# Patient Record
Sex: Female | Born: 1970 | Race: White | Hispanic: No | Marital: Married | Smoking: Current every day smoker
Health system: Southern US, Community
[De-identification: ages and names within clinical notes are randomized; demographics above are authoritative.]

## PROBLEM LIST (undated history)

## (undated) DIAGNOSIS — I2699 Other pulmonary embolism without acute cor pulmonale: Secondary | ICD-10-CM

## (undated) DIAGNOSIS — E785 Hyperlipidemia, unspecified: Secondary | ICD-10-CM

## (undated) DIAGNOSIS — E559 Vitamin D deficiency, unspecified: Secondary | ICD-10-CM

## (undated) HISTORY — PX: TUBAL LIGATION: SHX77

---

## 1999-03-13 ENCOUNTER — Other Ambulatory Visit: Admission: RE | Admit: 1999-03-13 | Discharge: 1999-03-13 | Payer: Self-pay | Admitting: Obstetrics & Gynecology

## 2002-06-29 ENCOUNTER — Other Ambulatory Visit: Admission: RE | Admit: 2002-06-29 | Discharge: 2002-06-29 | Payer: Self-pay | Admitting: Obstetrics and Gynecology

## 2002-07-01 ENCOUNTER — Encounter: Payer: Self-pay | Admitting: Obstetrics & Gynecology

## 2002-07-01 ENCOUNTER — Encounter: Admission: RE | Admit: 2002-07-01 | Discharge: 2002-07-01 | Payer: Self-pay | Admitting: Obstetrics & Gynecology

## 2016-03-20 DIAGNOSIS — H16403 Unspecified corneal neovascularization, bilateral: Secondary | ICD-10-CM | POA: Diagnosis not present

## 2017-05-25 DIAGNOSIS — Z0000 Encounter for general adult medical examination without abnormal findings: Secondary | ICD-10-CM | POA: Diagnosis not present

## 2017-05-25 DIAGNOSIS — Z1231 Encounter for screening mammogram for malignant neoplasm of breast: Secondary | ICD-10-CM | POA: Diagnosis not present

## 2017-06-02 DIAGNOSIS — E559 Vitamin D deficiency, unspecified: Secondary | ICD-10-CM | POA: Diagnosis not present

## 2017-06-02 DIAGNOSIS — Z0000 Encounter for general adult medical examination without abnormal findings: Secondary | ICD-10-CM | POA: Diagnosis not present

## 2017-09-16 DIAGNOSIS — L237 Allergic contact dermatitis due to plants, except food: Secondary | ICD-10-CM | POA: Diagnosis not present

## 2017-11-12 ENCOUNTER — Emergency Department (HOSPITAL_COMMUNITY)
Admission: EM | Admit: 2017-11-12 | Discharge: 2017-11-12 | Disposition: A | Discharge status: Home / Self Care | Payer: 59 | Attending: Emergency Medicine | Admitting: Emergency Medicine

## 2017-11-12 ENCOUNTER — Other Ambulatory Visit: Payer: Self-pay

## 2017-11-12 ENCOUNTER — Emergency Department (HOSPITAL_COMMUNITY): Payer: 59

## 2017-11-12 ENCOUNTER — Encounter (HOSPITAL_COMMUNITY): Payer: Self-pay | Admitting: Emergency Medicine

## 2017-11-12 DIAGNOSIS — E785 Hyperlipidemia, unspecified: Secondary | ICD-10-CM | POA: Insufficient documentation

## 2017-11-12 DIAGNOSIS — F17210 Nicotine dependence, cigarettes, uncomplicated: Secondary | ICD-10-CM | POA: Insufficient documentation

## 2017-11-12 DIAGNOSIS — K828 Other specified diseases of gallbladder: Secondary | ICD-10-CM | POA: Diagnosis not present

## 2017-11-12 DIAGNOSIS — I2699 Other pulmonary embolism without acute cor pulmonale: Secondary | ICD-10-CM | POA: Insufficient documentation

## 2017-11-12 DIAGNOSIS — R0789 Other chest pain: Secondary | ICD-10-CM | POA: Diagnosis not present

## 2017-11-12 DIAGNOSIS — R0602 Shortness of breath: Secondary | ICD-10-CM | POA: Diagnosis not present

## 2017-11-12 DIAGNOSIS — R1011 Right upper quadrant pain: Secondary | ICD-10-CM | POA: Diagnosis not present

## 2017-11-12 DIAGNOSIS — R079 Chest pain, unspecified: Secondary | ICD-10-CM | POA: Diagnosis not present

## 2017-11-12 HISTORY — DX: Vitamin D deficiency, unspecified: E55.9

## 2017-11-12 HISTORY — DX: Hyperlipidemia, unspecified: E78.5

## 2017-11-12 HISTORY — DX: Other pulmonary embolism without acute cor pulmonale: I26.99

## 2017-11-12 LAB — BASIC METABOLIC PANEL
Anion gap: 7 (ref 5–15)
BUN: 7 mg/dL (ref 6–20)
CO2: 25 mmol/L (ref 22–32)
Calcium: 8.9 mg/dL (ref 8.9–10.3)
Chloride: 106 mmol/L (ref 98–111)
Creatinine, Ser: 0.72 mg/dL (ref 0.44–1.00)
Glucose, Bld: 158 mg/dL — ABNORMAL HIGH (ref 70–99)
POTASSIUM: 3.7 mmol/L (ref 3.5–5.1)
SODIUM: 138 mmol/L (ref 135–145)

## 2017-11-12 LAB — CBC
HCT: 35.6 % — ABNORMAL LOW (ref 36.0–46.0)
Hemoglobin: 11.4 g/dL — ABNORMAL LOW (ref 12.0–15.0)
MCH: 33.3 pg (ref 26.0–34.0)
MCHC: 32 g/dL (ref 30.0–36.0)
MCV: 104.1 fL — ABNORMAL HIGH (ref 78.0–100.0)
PLATELETS: 221 10*3/uL (ref 150–400)
RBC: 3.42 MIL/uL — ABNORMAL LOW (ref 3.87–5.11)
RDW: 11.5 % (ref 11.5–15.5)
WBC: 9.2 10*3/uL (ref 4.0–10.5)

## 2017-11-12 LAB — I-STAT TROPONIN, ED: TROPONIN I, POC: 0 ng/mL (ref 0.00–0.08)

## 2017-11-12 MED ORDER — APIXABAN (ELIQUIS) EDUCATION KIT FOR DVT/PE PATIENTS: 1.0000 | PACK | Freq: Once | 0 refills | Status: AC

## 2017-11-12 MED ORDER — TRAMADOL HCL 50 MG PO TABS: 50.0000 mg | ORAL_TABLET | Freq: Four times a day (QID) | ORAL | 0 refills | Status: AC | PRN

## 2017-11-12 MED ORDER — ELIQUIS 5 MG VTE STARTER PACK: ORAL_TABLET | ORAL | 0 refills | Status: AC

## 2017-11-12 MED ORDER — APIXABAN 5 MG PO TABS
10.0000 mg | ORAL_TABLET | Freq: Once | ORAL | Status: AC
  Administered 2017-11-12: 10 mg via ORAL
  Filled 2017-11-12: qty 2

## 2017-11-12 NOTE — ED Provider Notes (Signed)
Patient placed in Quick Look pathway, seen and evaluated   Chief Complaint:chest pain  HPI: Carla Graham is a 47 y.o. female who presents to the ED with right side pain. Patient states that she developed shortness of breath 4 days ago that was worse with lying flat. Patient reports going to [facility] yesterday and having an elevated D-dimer and then a CT that showed a PE in the right lower lobe. Patient reports they wanted to start her on blood thinner but she did not start them. She has her paper work from yesterday visit.   ROS: CV: chest pain  Physical Exam:  BP (!) 145/81 (BP Location: Right Arm)   Pulse (!) 107   Temp 99.6 F (37.6 C) (Oral)   Resp 20   LMP 11/06/2017 (Exact Date)   SpO2 99%    Gen: No distress  Neuro: Awake and Alert  Skin: Warm and dry  Lungs: decreased breath sounds right   Heart: tachycardia   Initiation of care has begun. The patient has been counseled on the process, plan, and necessity for staying for the completion/evaluation, and the remainder of the medical screening examination    [name], NP 11/12/17 Sharyl Nimrod, MD 11/12/17 2032

## 2017-11-12 NOTE — ED Provider Notes (Signed)
[facility] EMERGENCY DEPARTMENT Provider Note   CSN: [id number] Arrival date & time: 11/12/17  1755     History   Chief Complaint Chief Complaint  Patient presents with  . Chest Pain    HPI Carla Graham is a 47 y.o. female.  The history is provided by the patient and medical records. No language interpreter was used.  Chest Pain       47 year old female presented to ED for evaluation of chest pain.  Patient report having pleuritic chest pain ongoing for the past 4 days.  Pain is located to right lower lateral chest, more prominent at nighttime especially when she lays down.  It was very minimal shortness of breath.  No fever chills lightheadedness or dizziness productive cough or hemoptysis.  No nausea vomiting diarrhea pain with eating.  Was initially seen at [facility] yesterday for her symptoms.  Chest CT angiogram was done and patient report she had a PE in the right distal lower lobe.  She was prescribed Eliquis 10 mg twice daily as treatment.  However, patient states she went to pharmacy to get the medication filled but it requires prior authorization because the dose is higher than usual.  She still endorse pain throughout the day today and decided to come here for further evaluation.  She did take ibuprofen 800 mg prior to arrival and now her pain has mostly subsided.  Patient denies any prior history of PE or DVT, no recent surgery, prolonged bedrest, active cancer, hemoptysis, or taking oral hormone.  Past Medical History:  Diagnosis Date  . Hyperlipidemia   . Pulmonary embolism ([place])   . Vitamin D deficiency     There are no active problems to display for this patient.   Past Surgical History:  Procedure Laterality Date  . TUBAL LIGATION       OB History   None      Home Medications    Prior to Admission medications   Not on File    Family History No family history on file.  Social History Social History   Tobacco Use    . Smoking status: Current Every Day Smoker    Packs/day: 0.50    Types: Cigarettes  . Smokeless tobacco: Never Used  Substance Use Topics  . Alcohol use: Yes    Comment: rare  . Drug use: Never     Allergies   Hydrocodone   Review of Systems Review of Systems  Cardiovascular: Positive for chest pain.  All other systems reviewed and are negative.    Physical Exam Updated Vital Signs BP (!) 145/81 (BP Location: Right Arm)   Pulse (!) 107   Temp 99.6 F (37.6 C) (Oral)   Resp 20   LMP 11/06/2017 (Exact Date)   SpO2 99%   Physical Exam  Constitutional: She appears well-developed and well-nourished. No distress.  Report pain when laying flat.  Pain to right lower chest.  HENT:  Head: Atraumatic.  Eyes: Conjunctivae are normal.  Neck: Neck supple.  Cardiovascular: Normal rate and regular rhythm.  Pulmonary/Chest: Effort normal and breath sounds normal.  Abdominal: Soft. Bowel sounds are normal. She exhibits no distension. There is no tenderness.  Musculoskeletal:       Right lower leg: She exhibits no edema.       Left lower leg: She exhibits no edema.  Neurological: She is alert.  Skin: No rash noted.  Psychiatric: She has a normal mood and affect.  Nursing note and  vitals reviewed.    ED Treatments / Results  Labs (all labs ordered are listed, but only abnormal results are displayed) Labs Reviewed  BASIC METABOLIC PANEL - Abnormal; Notable for the following components:      Result Value   Glucose, Bld 158 (*)    All other components within normal limits  CBC - Abnormal; Notable for the following components:   RBC 3.42 (*)    Hemoglobin 11.4 (*)    HCT 35.6 (*)    MCV 104.1 (*)    All other components within normal limits  I-STAT TROPONIN, ED    EKG EKG Interpretation  Date/Time:  Thursday November 12 2017 21:04:48 EDT Ventricular Rate:  82 PR Interval:  134 QRS Duration: 76 QT Interval:  362 QTC Calculation: 422 R Axis:   59 Text  Interpretation:  Normal sinus rhythm Nonspecific T wave abnormality Abnormal ECG No old tracing to compare Confirmed by [place], Inocente Salles [phone]) on 11/12/2017 9:16:06 PM   Radiology Dg Chest 2 View  Result Date: 11/12/2017 CLINICAL DATA:  Right lower lobe PE on outside hospital CT EXAM: CHEST - 2 VIEW COMPARISON:  None. FINDINGS: Lungs are clear.  No pleural effusion or pneumothorax. The heart is normal in size. Visualized osseous structures are within normal limits. IMPRESSION: Normal chest radiographs. Electronically Signed   By: Julian Hy M.D.   On: 11/12/2017 19:24    Procedures Procedures (including critical care time)  Medications Ordered in ED Medications  apixaban (ELIQUIS) tablet 10 mg (10 mg Oral Given 11/12/17 2135)     Initial Impression / Assessment and Plan / ED Course  I have reviewed the triage vital signs and the nursing notes.  Pertinent labs & imaging results that were available during my care of the patient were reviewed by me and considered in my medical decision making (see chart for details).     BP (!) 145/81 (BP Location: Right Arm)   Pulse (!) 107   Temp 99.6 F (37.6 C) (Oral)   Resp 20   LMP 11/06/2017 (Exact Date)   SpO2 99%    Final Clinical Impressions(s) / ED Diagnoses   Final diagnoses:  Acute pulmonary embolism, unspecified pulmonary embolism type, unspecified whether acute cor pulmonale present [facility])    ED Discharge Orders         Ordered    ELIQUIS STARTER PACK (ELIQUIS STARTER PACK) 5 MG TABS     11/12/17 2106    apixaban (ELIQUIS) KIT   Once     11/12/17 2106    traMADol (ULTRAM) 50 MG tablet  Every 6 hours PRN     11/12/17 2108         8:55 PM Patient here with right-sided chest pain and right upper quadrant abdominal pain for the past 4 days.  Was actually seen at [facility] this morning for her complaint.  Her work-up is remarkable for an elevated d-dimer as well as a CT scan which shows evidence of PE.  No  evidence of right heart strain were noted.  She also had an right upper quadrant abdominal ultrasound that showed small amount of sludge within the gallbladder but no evidence of stones or cholecystitis.  Patient was initially given Eliquis but declined.  She received a prescription for Eliquis, but states when she went to get it filled, it requires prior authorization because the dosage was higher than recommended.  She was prescribed Eliquis 10 mg twice daily.  I have a chance to review all  of her labs and imaging results that patient brought along with her.  At this time, I felt his symptom is likely related to her pulmonary embolism.  She will receive initial dose of Eliquis in the ED.  Patient discharged home with coupon for Eliquis as well as an educational kit.  She will need to follow-up with her primary care doctor for further prior authorization.  Recommend patient to avoid taking Advil for pain.  Patient will receive opiate pain medication to use at nighttime when her pain is most significant. In order to decrease risk of narcotic abuse. Pt's record were checked using the [place] Controlled Substance database.     Tran, [place], PA-C 11/12/17 2155    Jacubowitz, Sam, MD 11/13/17 0121  

## 2017-11-12 NOTE — ED Triage Notes (Signed)
Patient to ED c/o worsening pain in R lateral side, more with lying flat and taking deep breaths x 4 days. She reports going to [facility] yesterday and had CTA done that showed PE in R distal lower lobe. She denies shortness of breath. Resp e/u, skin warm/dry.

## 2017-11-13 DIAGNOSIS — I2699 Other pulmonary embolism without acute cor pulmonale: Secondary | ICD-10-CM | POA: Diagnosis not present

## 2017-11-13 DIAGNOSIS — Z6825 Body mass index (BMI) 25.0-25.9, adult: Secondary | ICD-10-CM | POA: Diagnosis not present

## 2017-11-13 DIAGNOSIS — E663 Overweight: Secondary | ICD-10-CM | POA: Diagnosis not present

## 2018-01-27 DIAGNOSIS — I2699 Other pulmonary embolism without acute cor pulmonale: Secondary | ICD-10-CM | POA: Diagnosis not present

## 2018-01-27 DIAGNOSIS — E559 Vitamin D deficiency, unspecified: Secondary | ICD-10-CM | POA: Diagnosis not present

## 2018-01-27 DIAGNOSIS — Z79899 Other long term (current) drug therapy: Secondary | ICD-10-CM | POA: Diagnosis not present

## 2018-01-27 DIAGNOSIS — E663 Overweight: Secondary | ICD-10-CM | POA: Diagnosis not present

## 2018-01-27 DIAGNOSIS — R739 Hyperglycemia, unspecified: Secondary | ICD-10-CM | POA: Diagnosis not present

## 2018-01-27 DIAGNOSIS — E7800 Pure hypercholesterolemia, unspecified: Secondary | ICD-10-CM | POA: Diagnosis not present

## 2018-01-27 DIAGNOSIS — Z6825 Body mass index (BMI) 25.0-25.9, adult: Secondary | ICD-10-CM | POA: Diagnosis not present

## 2018-01-27 DIAGNOSIS — D509 Iron deficiency anemia, unspecified: Secondary | ICD-10-CM | POA: Diagnosis not present

## 2018-02-04 DIAGNOSIS — D649 Anemia, unspecified: Secondary | ICD-10-CM | POA: Diagnosis not present

## 2018-02-04 DIAGNOSIS — Z8489 Family history of other specified conditions: Secondary | ICD-10-CM | POA: Diagnosis not present

## 2018-02-04 DIAGNOSIS — N920 Excessive and frequent menstruation with regular cycle: Secondary | ICD-10-CM

## 2018-02-04 DIAGNOSIS — I2699 Other pulmonary embolism without acute cor pulmonale: Secondary | ICD-10-CM | POA: Diagnosis not present

## 2018-02-04 DIAGNOSIS — D508 Other iron deficiency anemias: Secondary | ICD-10-CM

## 2018-02-04 DIAGNOSIS — Z7901 Long term (current) use of anticoagulants: Secondary | ICD-10-CM | POA: Diagnosis not present

## 2018-02-11 DIAGNOSIS — Z7901 Long term (current) use of anticoagulants: Secondary | ICD-10-CM | POA: Diagnosis not present

## 2018-02-11 DIAGNOSIS — I2699 Other pulmonary embolism without acute cor pulmonale: Secondary | ICD-10-CM | POA: Diagnosis not present

## 2018-02-18 DIAGNOSIS — Z7901 Long term (current) use of anticoagulants: Secondary | ICD-10-CM | POA: Diagnosis not present

## 2018-02-18 DIAGNOSIS — Z7982 Long term (current) use of aspirin: Secondary | ICD-10-CM

## 2018-02-18 DIAGNOSIS — Z86711 Personal history of pulmonary embolism: Secondary | ICD-10-CM | POA: Diagnosis not present

## 2018-02-18 DIAGNOSIS — I2699 Other pulmonary embolism without acute cor pulmonale: Secondary | ICD-10-CM | POA: Diagnosis not present

## 2018-02-18 DIAGNOSIS — N920 Excessive and frequent menstruation with regular cycle: Secondary | ICD-10-CM

## 2018-02-18 DIAGNOSIS — D6851 Activated protein C resistance: Secondary | ICD-10-CM | POA: Diagnosis not present

## 2018-02-18 DIAGNOSIS — D508 Other iron deficiency anemias: Secondary | ICD-10-CM | POA: Diagnosis not present

## 2018-02-23 DIAGNOSIS — D509 Iron deficiency anemia, unspecified: Secondary | ICD-10-CM | POA: Diagnosis not present

## 2018-03-02 DIAGNOSIS — D509 Iron deficiency anemia, unspecified: Secondary | ICD-10-CM | POA: Diagnosis not present

## 2018-03-04 DIAGNOSIS — D509 Iron deficiency anemia, unspecified: Secondary | ICD-10-CM | POA: Diagnosis not present

## 2018-03-04 DIAGNOSIS — Z6825 Body mass index (BMI) 25.0-25.9, adult: Secondary | ICD-10-CM | POA: Diagnosis not present

## 2018-03-04 DIAGNOSIS — E663 Overweight: Secondary | ICD-10-CM | POA: Diagnosis not present

## 2018-03-15 DIAGNOSIS — N920 Excessive and frequent menstruation with regular cycle: Secondary | ICD-10-CM | POA: Diagnosis not present

## 2018-03-15 DIAGNOSIS — Z01419 Encounter for gynecological examination (general) (routine) without abnormal findings: Secondary | ICD-10-CM | POA: Diagnosis not present

## 2018-03-15 DIAGNOSIS — Z124 Encounter for screening for malignant neoplasm of cervix: Secondary | ICD-10-CM | POA: Diagnosis not present

## 2018-04-28 DIAGNOSIS — Z6826 Body mass index (BMI) 26.0-26.9, adult: Secondary | ICD-10-CM | POA: Diagnosis not present

## 2018-04-28 DIAGNOSIS — M25512 Pain in left shoulder: Secondary | ICD-10-CM | POA: Diagnosis not present

## 2018-04-28 DIAGNOSIS — K219 Gastro-esophageal reflux disease without esophagitis: Secondary | ICD-10-CM | POA: Diagnosis not present

## 2018-05-10 DIAGNOSIS — E7800 Pure hypercholesterolemia, unspecified: Secondary | ICD-10-CM | POA: Diagnosis not present

## 2018-05-10 DIAGNOSIS — M25512 Pain in left shoulder: Secondary | ICD-10-CM | POA: Diagnosis not present

## 2018-05-10 DIAGNOSIS — E559 Vitamin D deficiency, unspecified: Secondary | ICD-10-CM | POA: Diagnosis not present

## 2018-06-23 DIAGNOSIS — M25512 Pain in left shoulder: Secondary | ICD-10-CM | POA: Diagnosis not present

## 2018-07-01 DIAGNOSIS — M25512 Pain in left shoulder: Secondary | ICD-10-CM | POA: Diagnosis not present

## 2018-07-07 DIAGNOSIS — M25512 Pain in left shoulder: Secondary | ICD-10-CM | POA: Diagnosis not present

## 2018-07-07 DIAGNOSIS — M7542 Impingement syndrome of left shoulder: Secondary | ICD-10-CM | POA: Diagnosis not present

## 2018-07-26 DIAGNOSIS — D509 Iron deficiency anemia, unspecified: Secondary | ICD-10-CM

## 2020-05-20 IMAGING — DX DG CHEST 2V
2 series · 2 of 2 positions shown · non-contrast
Comparison: None.

CLINICAL DATA: Right lower lobe PE on outside hospital CT

EXAM:
CHEST - 2 VIEW

[chest pa]
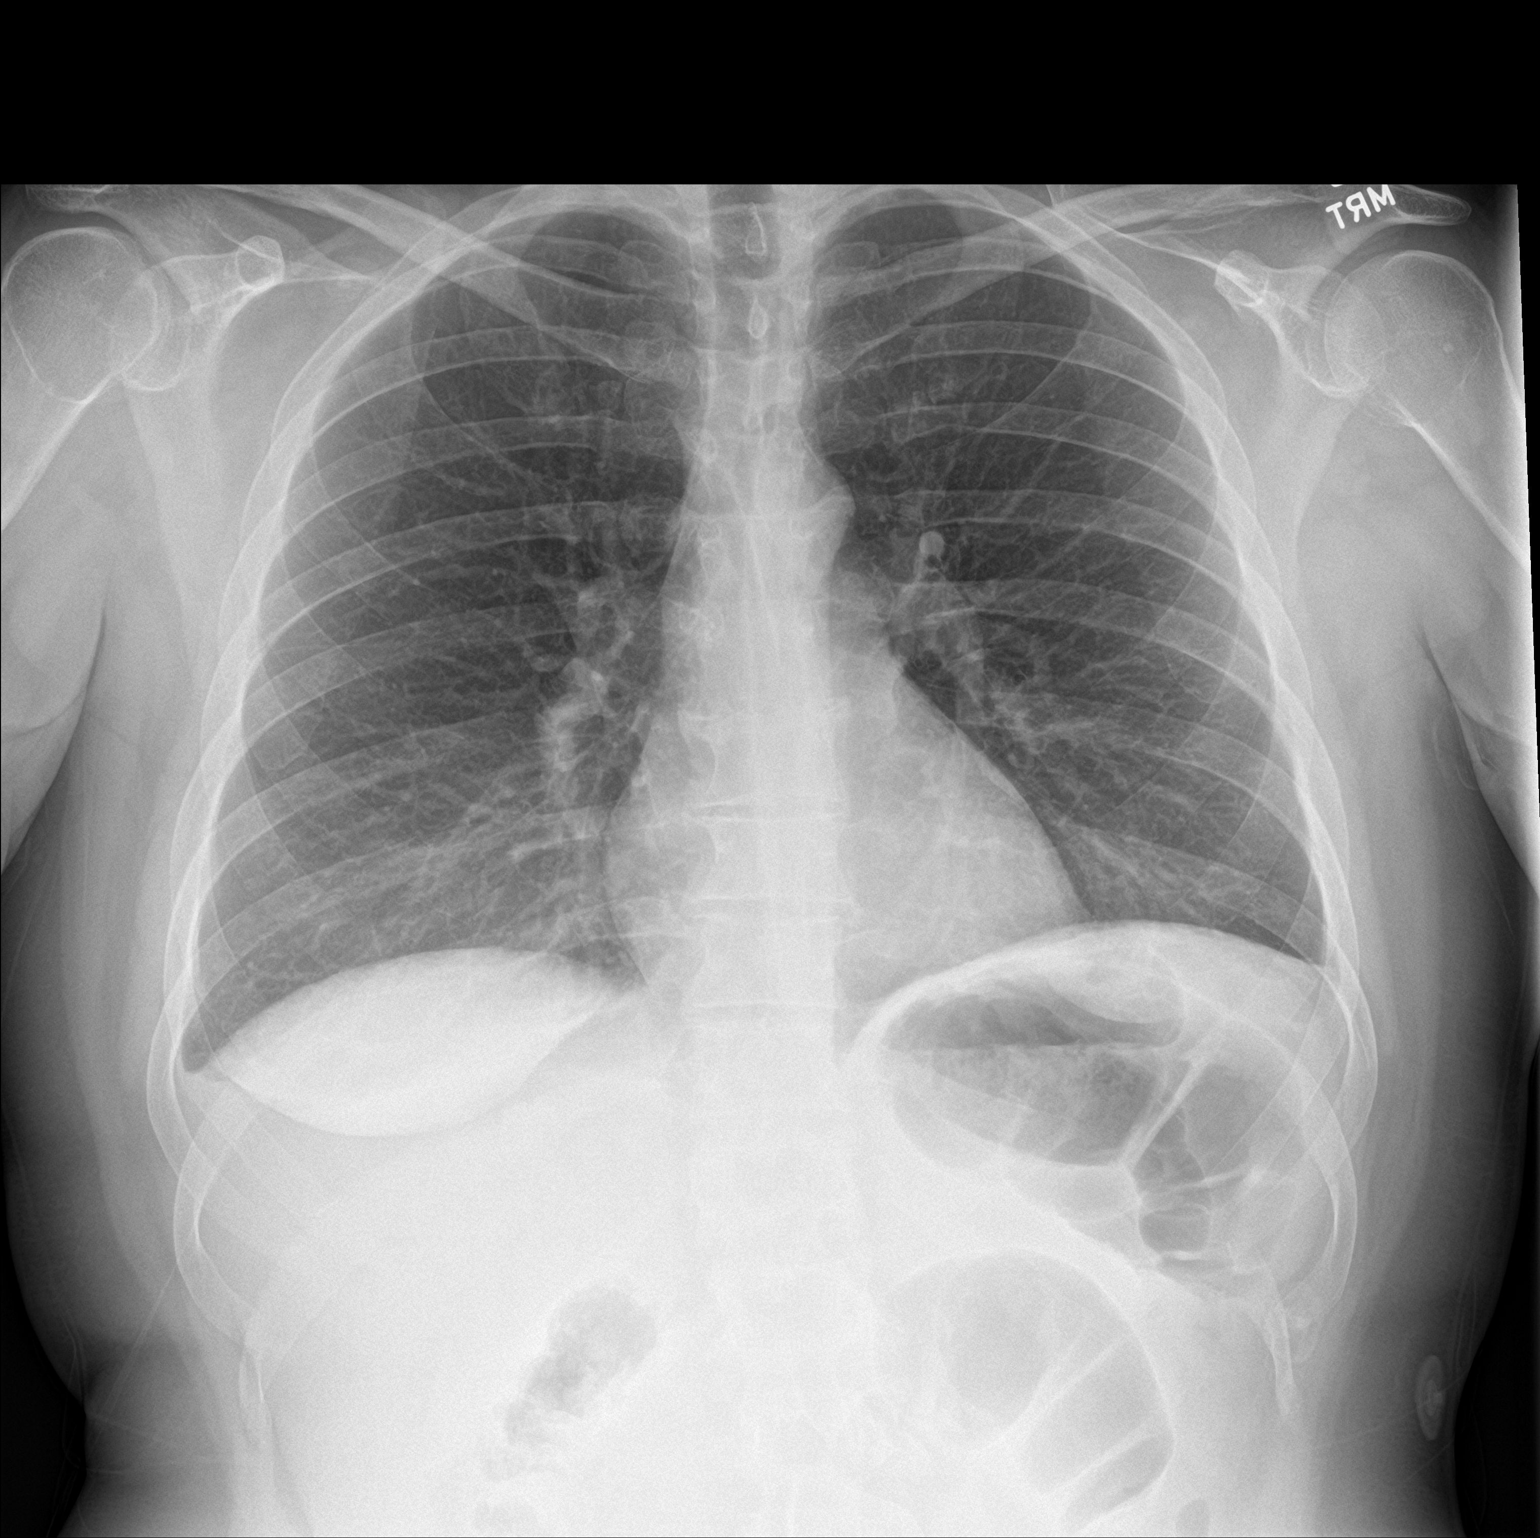

[chest lat]
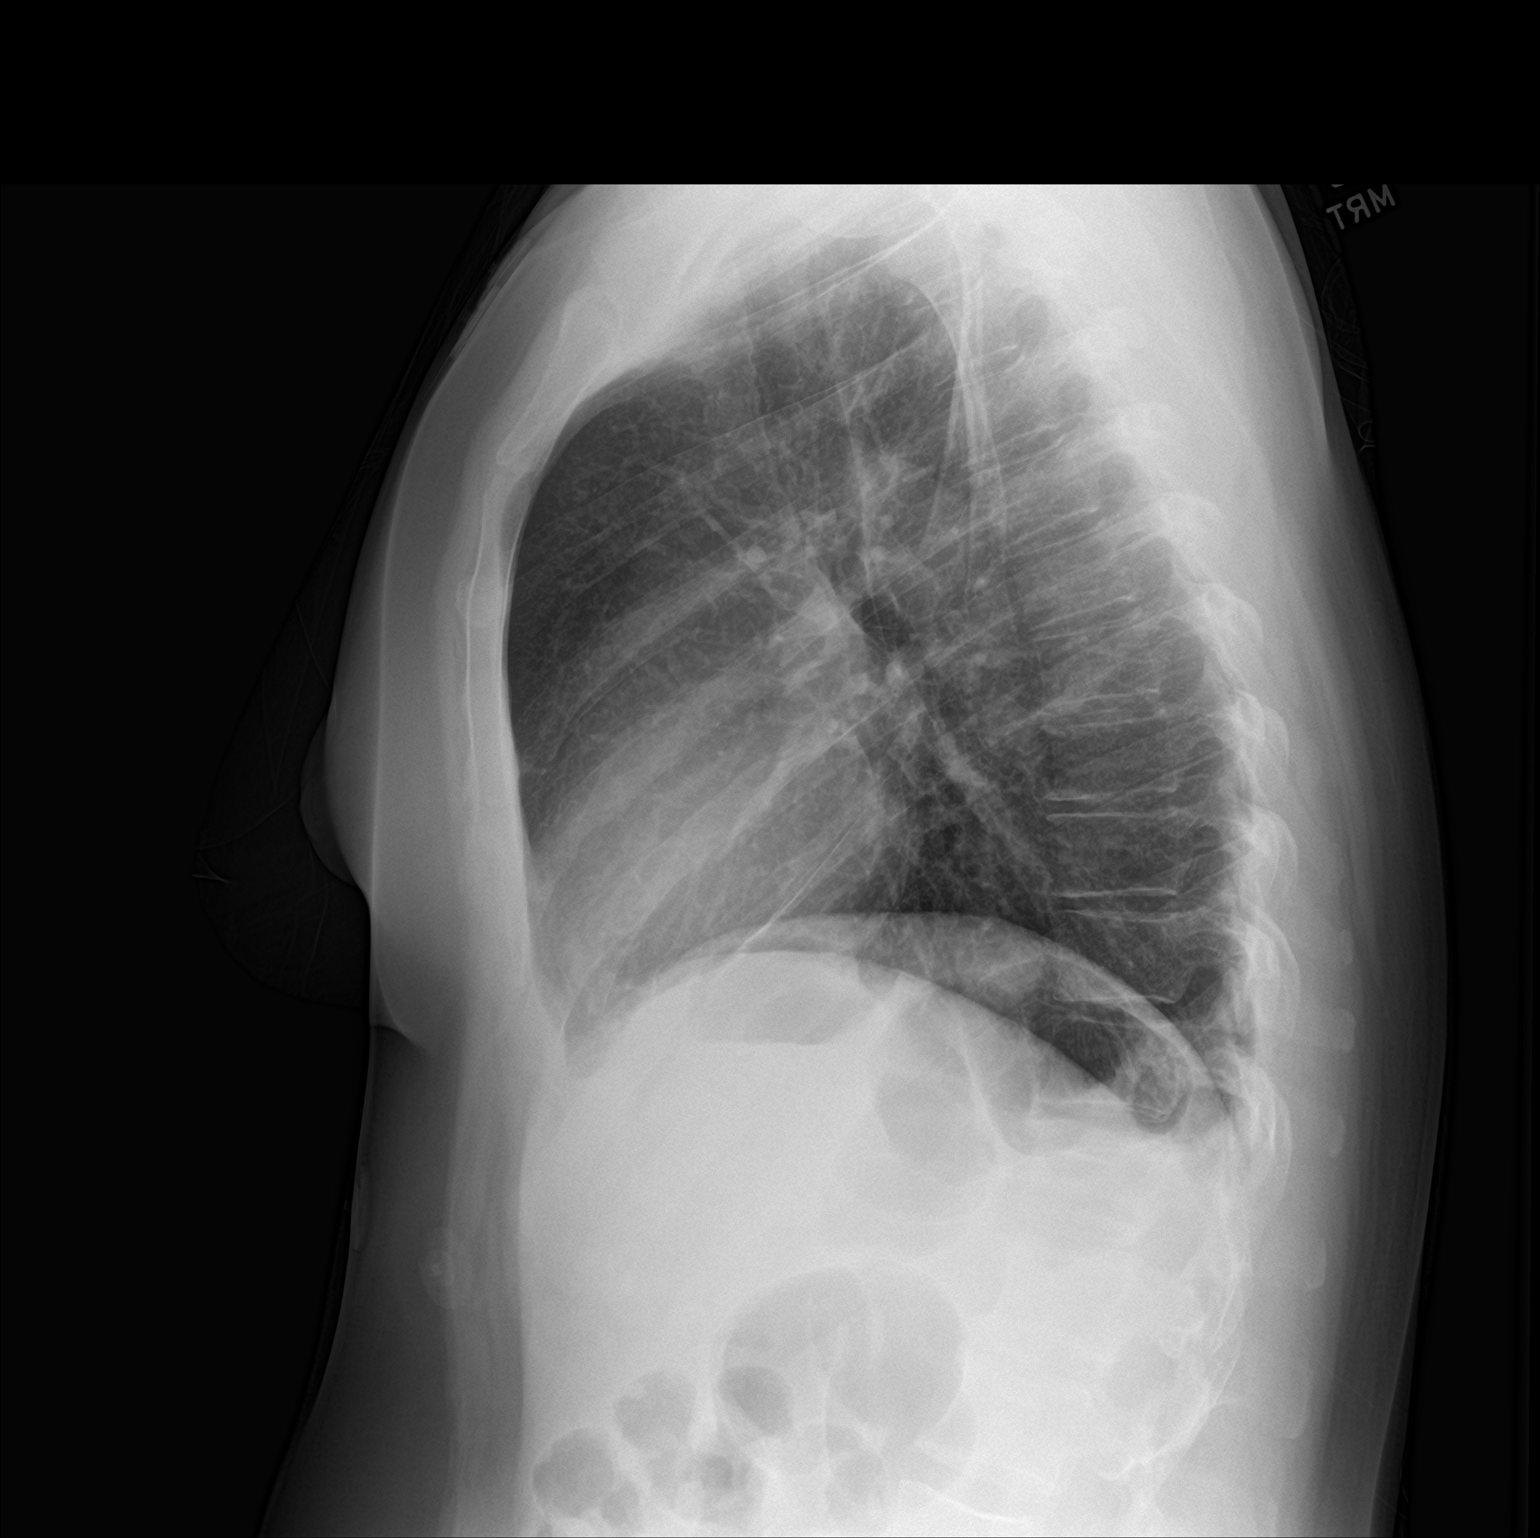

[2 of 2 positions shown; findings below may reference images not displayed]

FINDINGS: Lungs are clear.  No pleural effusion or pneumothorax.

The heart is normal in size.

Visualized osseous structures are within normal limits.
IMPRESSION: Normal chest radiographs.
